# Patient Record
Sex: Female | Born: 1995 | Race: White | Hispanic: No | Marital: Single | Smoking: Never smoker
Health system: Southern US, Community
[De-identification: ages and names within clinical notes are randomized; demographics above are authoritative.]

## PROBLEM LIST (undated history)

## (undated) DIAGNOSIS — J45909 Unspecified asthma, uncomplicated: Secondary | ICD-10-CM

## (undated) HISTORY — DX: Unspecified asthma, uncomplicated: J45.909

## (undated) HISTORY — PX: ADENOIDECTOMY: SUR15

---
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
## 2016-12-10 DIAGNOSIS — S0502XA Injury of conjunctiva and corneal abrasion without foreign body, left eye, initial encounter: Secondary | ICD-10-CM | POA: Diagnosis not present

## 2017-02-28 DIAGNOSIS — J019 Acute sinusitis, unspecified: Secondary | ICD-10-CM | POA: Diagnosis not present

## 2017-03-21 DIAGNOSIS — H52222 Regular astigmatism, left eye: Secondary | ICD-10-CM | POA: Diagnosis not present

## 2017-03-21 DIAGNOSIS — H5213 Myopia, bilateral: Secondary | ICD-10-CM | POA: Diagnosis not present

## 2017-05-16 DIAGNOSIS — Z0000 Encounter for general adult medical examination without abnormal findings: Secondary | ICD-10-CM | POA: Diagnosis not present

## 2017-05-16 DIAGNOSIS — R5383 Other fatigue: Secondary | ICD-10-CM | POA: Diagnosis not present

## 2017-05-16 DIAGNOSIS — Z23 Encounter for immunization: Secondary | ICD-10-CM | POA: Diagnosis not present

## 2017-10-18 DIAGNOSIS — J020 Streptococcal pharyngitis: Secondary | ICD-10-CM | POA: Diagnosis not present

## 2017-10-26 DIAGNOSIS — Z792 Long term (current) use of antibiotics: Secondary | ICD-10-CM | POA: Diagnosis not present

## 2017-10-26 DIAGNOSIS — S298XXA Other specified injuries of thorax, initial encounter: Secondary | ICD-10-CM | POA: Diagnosis not present

## 2017-10-26 DIAGNOSIS — R0789 Other chest pain: Secondary | ICD-10-CM | POA: Diagnosis not present

## 2018-03-19 DIAGNOSIS — H5213 Myopia, bilateral: Secondary | ICD-10-CM | POA: Diagnosis not present

## 2018-03-19 DIAGNOSIS — H52223 Regular astigmatism, bilateral: Secondary | ICD-10-CM | POA: Diagnosis not present

## 2018-03-19 DIAGNOSIS — H1045 Other chronic allergic conjunctivitis: Secondary | ICD-10-CM | POA: Diagnosis not present

## 2018-04-10 DIAGNOSIS — Z0000 Encounter for general adult medical examination without abnormal findings: Secondary | ICD-10-CM | POA: Diagnosis not present

## 2018-04-10 DIAGNOSIS — J4520 Mild intermittent asthma, uncomplicated: Secondary | ICD-10-CM | POA: Diagnosis not present

## 2020-11-01 ENCOUNTER — Other Ambulatory Visit: Payer: Commercial Managed Care - PPO

## 2020-11-01 DIAGNOSIS — Z20822 Contact with and (suspected) exposure to covid-19: Secondary | ICD-10-CM

## 2020-11-02 LAB — SARS-COV-2, NAA 2 DAY TAT

## 2020-11-02 LAB — NOVEL CORONAVIRUS, NAA: SARS-CoV-2, NAA: NOT DETECTED

## 2024-10-26 ENCOUNTER — Emergency Department (HOSPITAL_BASED_OUTPATIENT_CLINIC_OR_DEPARTMENT_OTHER): Admission: EM | Admit: 2024-10-26 | Discharge: 2024-10-26 | Disposition: A | Discharge status: Home / Self Care

## 2024-10-26 ENCOUNTER — Emergency Department (HOSPITAL_BASED_OUTPATIENT_CLINIC_OR_DEPARTMENT_OTHER): Admitting: Radiology

## 2024-10-26 DIAGNOSIS — R509 Fever, unspecified: Secondary | ICD-10-CM | POA: Diagnosis present

## 2024-10-26 DIAGNOSIS — M940 Chondrocostal junction syndrome [Tietze]: Secondary | ICD-10-CM | POA: Diagnosis not present

## 2024-10-26 DIAGNOSIS — J189 Pneumonia, unspecified organism: Secondary | ICD-10-CM | POA: Insufficient documentation

## 2024-10-26 LAB — BASIC METABOLIC PANEL WITH GFR
Anion gap: 11 (ref 5–15)
BUN: 9 mg/dL (ref 6–20)
CO2: 25 mmol/L (ref 22–32)
Calcium: 9.1 mg/dL (ref 8.9–10.3)
Chloride: 100 mmol/L (ref 98–111)
Creatinine, Ser: 0.62 mg/dL (ref 0.44–1.00)
GFR, Estimated: >60 mL/min
Glucose, Bld: 104 mg/dL — ABNORMAL HIGH (ref 70–99)
Potassium: 4 mmol/L (ref 3.5–5.1)
Sodium: 136 mmol/L (ref 135–145)

## 2024-10-26 LAB — CBC
HCT: 38 % (ref 36.0–46.0)
Hemoglobin: 13.2 g/dL (ref 12.0–15.0)
MCH: 30 pg (ref 26.0–34.0)
MCHC: 34.7 g/dL (ref 30.0–36.0)
MCV: 86.4 fL (ref 80.0–100.0)
Platelets: 288 K/uL (ref 150–400)
RBC: 4.4 MIL/uL (ref 3.87–5.11)
RDW: 12 % (ref 11.5–15.5)
WBC: 7.6 K/uL (ref 4.0–10.5)
nRBC: 0 % (ref 0.0–0.2)

## 2024-10-26 LAB — PREGNANCY, URINE: Preg Test, Ur: NEGATIVE

## 2024-10-26 LAB — TROPONIN T, HIGH SENSITIVITY: Troponin T High Sensitivity: <15 ng/L (ref 0–19)

## 2024-10-26 MED ORDER — AZITHROMYCIN 250 MG PO TABS: 500.0000 mg | ORAL_TABLET | Freq: Every day | ORAL | 0 refills | Status: AC

## 2024-10-26 MED ORDER — PREDNISONE 5 MG PO TABS: 5.0000 mg | ORAL_TABLET | Freq: Every day | ORAL | 0 refills | Status: AC

## 2024-10-26 NOTE — ED Provider Notes (Signed)
 " [place] [facility]GENC[facility] DEPARTMENT AT [facility] Provider Note   CSN: [id number] Arrival date & time: 10/26/24  1845     Patient presents with: Chest Pain   Kristy Werner is a 28 y.o. female.   28 year old female presents for evaluation of chest pain and fever.  States she was diagnosed with flu a few weeks ago and got better.  She states today around [place] she developed some sharp chest pain that is been worse with coughing.  She has had some coughing and vomiting for the last few days as well.  States she did develop a fever today also.  Denies any shortness of breath, back pain, diarrhea, or any other symptoms or concerns.   Chest Pain Associated symptoms: cough and fever   Associated symptoms: no abdominal pain, no back pain, no palpitations, no shortness of breath and no vomiting        Prior to Admission medications  Medication Sig Start Date End Date Taking? Authorizing Provider  azithromycin (ZITHROMAX) 250 MG tablet Take 2 tablets (500 mg total) by mouth daily for 3 days. Take first 2 tablets together, then 1 every day until finished. 10/26/24 10/29/24 Yes Zhane Bluitt L, DO  predniSONE (DELTASONE) 5 MG tablet Take 1 tablet (5 mg total) by mouth daily with breakfast. 10/26/24  Yes Sitlali Koerner L, DO  acetaminophen -codeine  (TYLENOL  #3) 300-30 MG per tablet Take 1-2 tablets by mouth every 6 (six) hours as needed for pain. 07/16/12   Hess, Catheryn HERO, PA-C  adapalene (DIFFERIN) 0.1 % gel Apply 1 application topically at bedtime.    [provider]  albuterol (PROVENTIL HFA;VENTOLIN HFA) 108 (90 BASE) MCG/ACT inhaler Inhale into the lungs every 6 (six) hours as needed for wheezing or shortness of breath.    [provider]  beclomethasone (QVAR) 40 MCG/ACT inhaler Inhale into the lungs 2 (two) times daily.    [provider]  levonorgestrel-ethinyl estradiol (AVIANE,ALESSE,LESSINA) 0.1-20 MG-MCG tablet Take 1 tablet by mouth daily.     [provider]    Allergies: Patient has no known allergies.    Review of Systems  Constitutional:  Positive for fever. Negative for chills.  HENT:  Negative for ear pain and sore throat.   Eyes:  Negative for pain and visual disturbance.  Respiratory:  Positive for cough. Negative for shortness of breath.   Cardiovascular:  Positive for chest pain. Negative for palpitations.  Gastrointestinal:  Negative for abdominal pain and vomiting.  Genitourinary:  Negative for dysuria and hematuria.  Musculoskeletal:  Negative for arthralgias and back pain.  Skin:  Negative for color change and rash.  Neurological:  Negative for seizures and syncope.  All other systems reviewed and are negative.   Updated Vital Signs BP 133/88   Pulse 98   Temp 100.2 F (37.9 C)   Resp 14   SpO2 100%   Physical Exam Vitals and nursing note reviewed.  Constitutional:      General: She is not in acute distress.    Appearance: She is well-developed. She is not ill-appearing.  HENT:     Head: Normocephalic and atraumatic.  Eyes:     Conjunctiva/sclera: Conjunctivae normal.  Cardiovascular:     Rate and Rhythm: Normal rate and regular rhythm.     Heart sounds: No murmur heard. Pulmonary:     Effort: Pulmonary effort is normal. No respiratory distress.     Breath sounds: Normal breath sounds.  Chest:     Chest wall: Tenderness  present.  Abdominal:     Palpations: Abdomen is soft.     Tenderness: There is no abdominal tenderness.  Musculoskeletal:        General: No swelling.     Cervical back: Neck supple.  Skin:    General: Skin is warm and dry.     Capillary Refill: Capillary refill takes less than 2 seconds.  Neurological:     Mental Status: She is alert.  Psychiatric:        Mood and Affect: Mood normal.     (all labs ordered are listed, but only abnormal results are displayed) Labs Reviewed  BASIC METABOLIC PANEL WITH GFR - Abnormal; Notable for the following components:       Result Value   Glucose, Bld 104 (*)    All other components within normal limits  CBC  PREGNANCY, URINE  TROPONIN T, HIGH SENSITIVITY  TROPONIN T, HIGH SENSITIVITY    EKG: EKG Interpretation Date/Time:  Tuesday October 26 2024 18:52:44 EST Ventricular Rate:  96 PR Interval:  136 QRS Duration:  82 QT Interval:  346 QTC Calculation: 437 R Axis:   90  Text Interpretation: Normal sinus rhythm Rightward axis  No previous EKG for comparison Confirmed by Gennaro Bouchard ([id number]) on 10/26/2024 8:42:28 PM  Radiology: DG Chest 2 View Result Date: 10/26/2024 EXAM: 2 VIEW(S) XRAY OF THE CHEST 10/26/2024 07:09:00 PM COMPARISON: None available. CLINICAL HISTORY: chest pain FINDINGS: LUNGS AND PLEURA: There is central peribronchial wall thickening bilaterally. No pleural effusion. No pneumothorax. No focal lung infiltrate. HEART AND MEDIASTINUM: No acute abnormality of the cardiac and mediastinal silhouettes. BONES AND SOFT TISSUES: No acute osseous abnormality. IMPRESSION: 1. Central peribronchial wall thickening bilaterally which may be related to reactive airway disease or viral infection. . Electronically signed by: Greig Pique MD 10/26/2024 08:28 PM EST RP Workstation: [id number]     Procedures   Medications Ordered in the ED - No data to display                                  Medical Decision Making Patient here for chest pain after coughing and vomiting.  Also with a fever here.  X-ray shows viral pattern of disease versus atypical pneumonia.  Lab workup and EKG unremarkable and vitals are stable.  Advised Tylenol  and Motrin as needed for pain will start her on steroids and antibiotics.  Advise close up with primary care and otherwise return to the ER for new or worsening symptoms.  She feels comfortable to plan to be discharged home.  Problems Addressed: Atypical pneumonia: acute illness or injury Costochondritis: acute illness or injury  Amount and/or Complexity of Data  Reviewed External Data Reviewed: notes.    Details: No prior ER records for review Labs: ordered. Decision-making details documented in ED Course.    Details: Ordered and reviewed by me and unremarkable, troponin negative Radiology: ordered and independent interpretation performed. Decision-making details documented in ED Course.    Details: Ordered and interpreted by me independently of radiology Chest x-ray: Shows evidence of atypical pneumonia versus viral disease ECG/medicine tests: ordered and independent interpretation performed. Decision-making details documented in ED Course.    Details: Ordered and interpreted by me in the absence of cardiology and shows sinus rhythm, no STEMI, or significant change when compared to prior EKG  Risk OTC drugs. Prescription drug management.    Final diagnoses:  Atypical pneumonia  Costochondritis  ED Discharge Orders          Ordered    azithromycin (ZITHROMAX) 250 MG tablet  Daily        10/26/24 2122    predniSONE (DELTASONE) 5 MG tablet  Daily with breakfast        10/26/24 2122               Gennaro Duwaine CROME, DO 10/26/24 2136  "

## 2024-10-26 NOTE — Discharge Instructions (Signed)
 Take your antibiotics and your steroids as prescribed.  You can alternate Tylenol  Motrin as needed for pain and fever.  Follow-up with your primary care doctor in 1 to 2 weeks otherwise return to the ER for any new or worsening symptoms.

## 2024-10-26 NOTE — ED Triage Notes (Signed)
 Patient reports central chest pain since 11pm last night. Worse when lying down. Fever tonight. Vomiting last night which patient states is typical for
